# Patient Record
Sex: Female | Born: 1970 | Hispanic: No | Marital: Married | Smoking: Never smoker
Health system: Southern US, Community
[De-identification: ages and names within clinical notes are randomized; demographics above are authoritative.]

## PROBLEM LIST (undated history)

## (undated) HISTORY — PX: HERNIA REPAIR: SHX51

---

## 2008-12-23 LAB — CONVERTED CEMR LAB: Pap Smear: NORMAL

## 2009-02-25 ENCOUNTER — Ambulatory Visit: Payer: Self-pay | Admitting: Internal Medicine

## 2009-02-25 DIAGNOSIS — R059 Cough, unspecified: Secondary | ICD-10-CM | POA: Insufficient documentation

## 2009-02-25 DIAGNOSIS — R05 Cough: Secondary | ICD-10-CM

## 2009-02-27 ENCOUNTER — Ambulatory Visit: Payer: Self-pay | Admitting: Internal Medicine

## 2009-03-06 ENCOUNTER — Telehealth: Payer: Self-pay | Admitting: Internal Medicine

## 2009-12-30 IMAGING — CR DG CHEST 2V
2 series · 2 of 2 positions shown · non-contrast
Comparison: 02/25/2009.

CLINICAL DATA: Cough and short of breath.

CHEST - 2 VIEW

[view not recorded (1 of 2)]
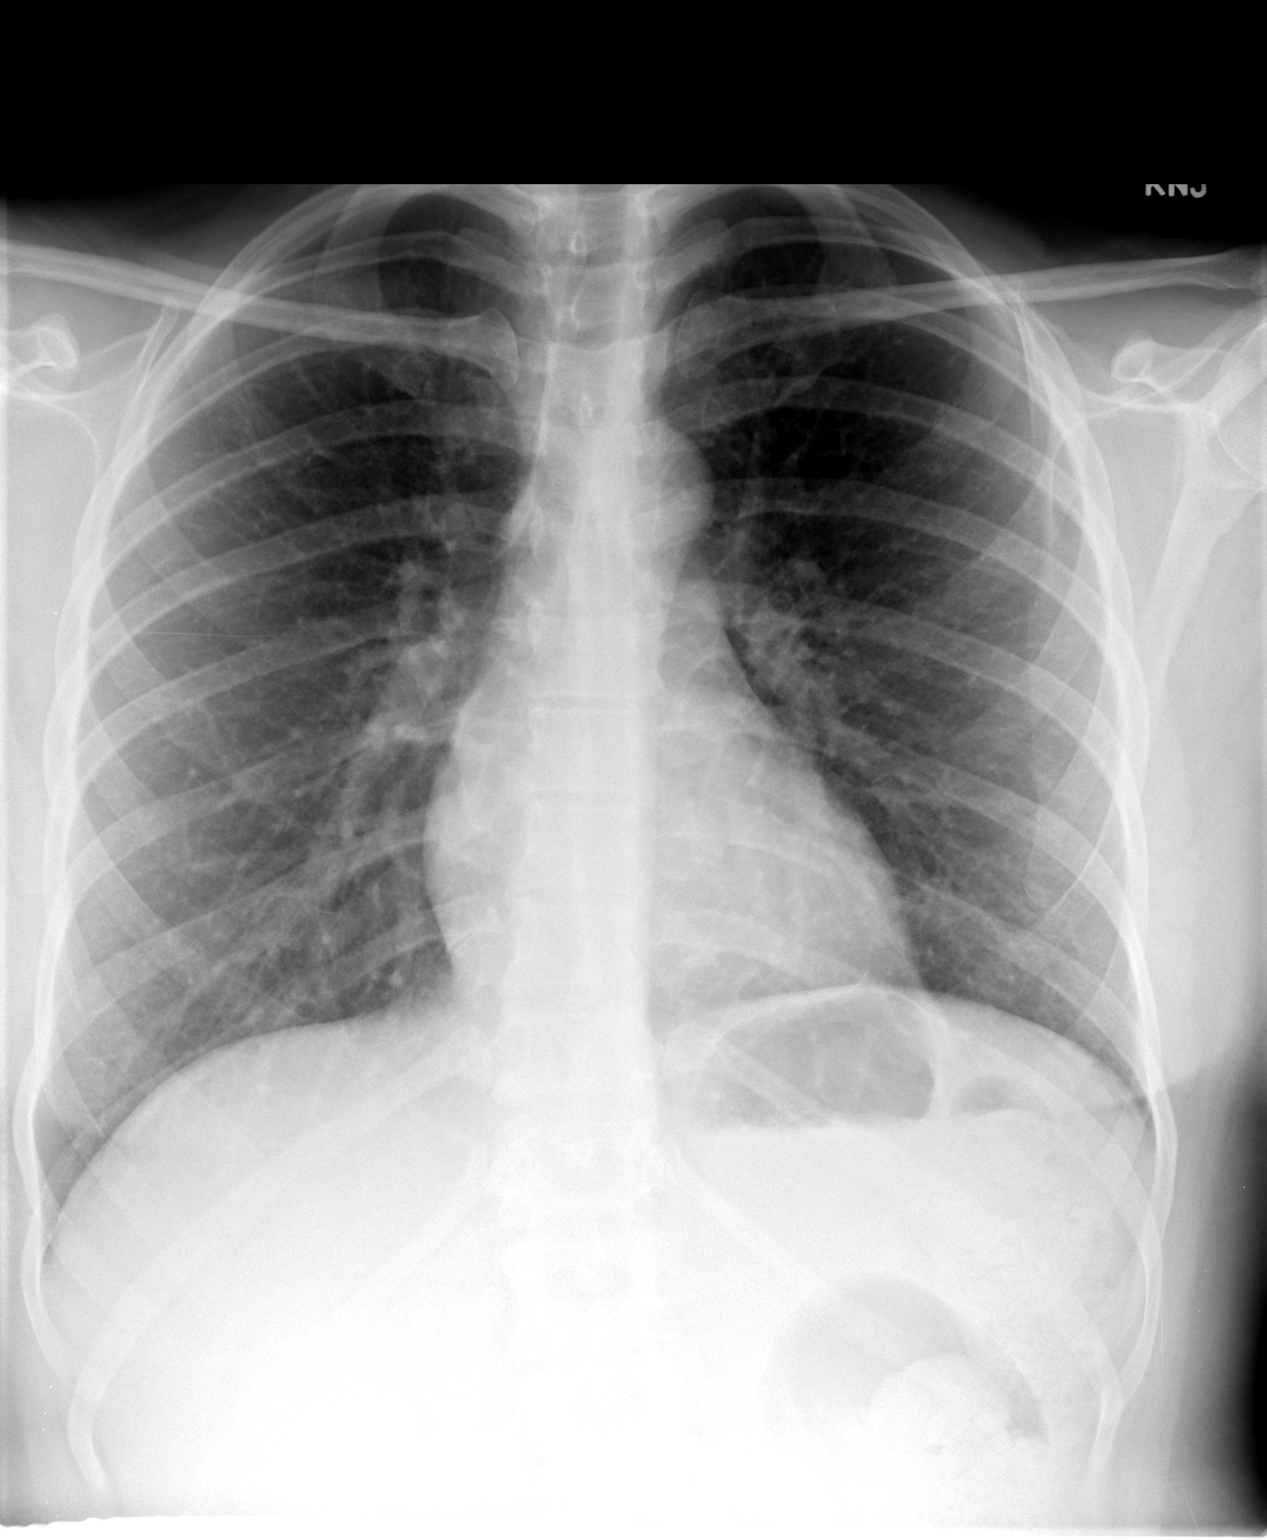

[view not recorded (2 of 2)]
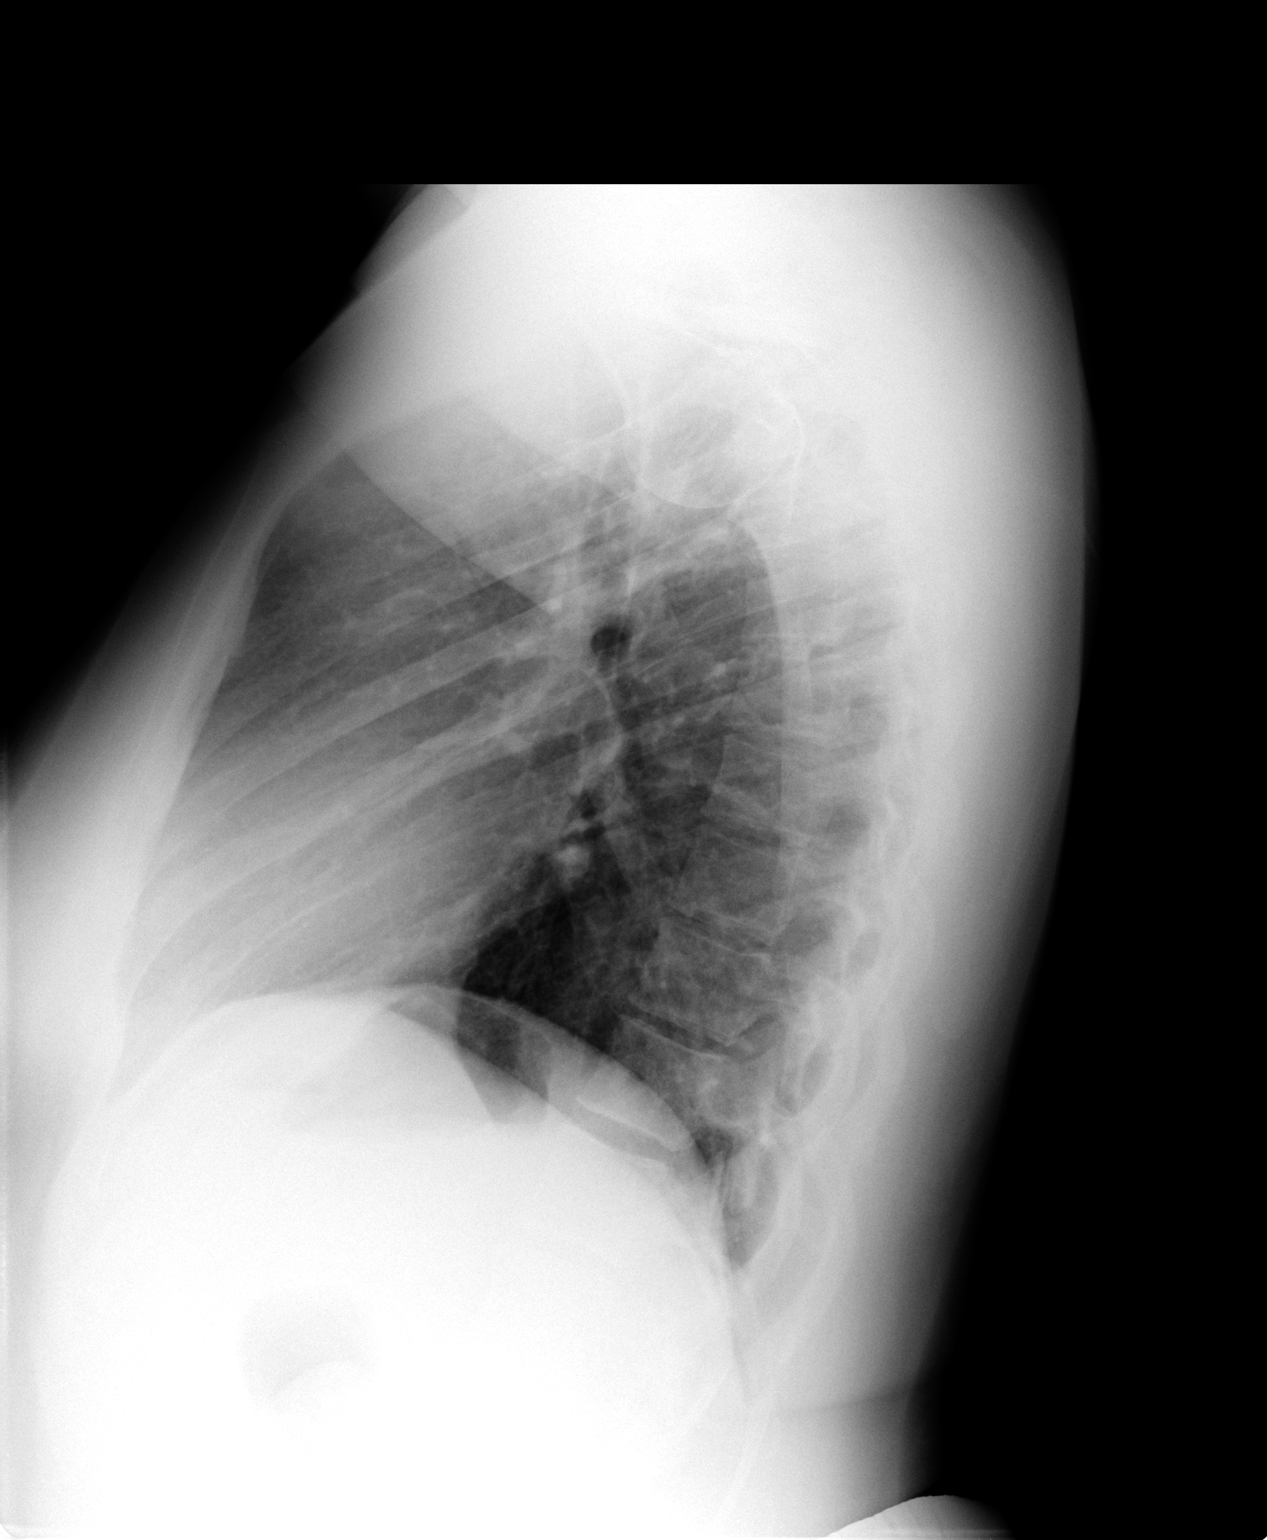

[2 of 2 positions shown; findings below may reference images not displayed]

FINDINGS: Heart size is normal.  There is no heart failure,
infiltrate, or effusion.  The lungs are clear.
IMPRESSION: No active cardiopulmonary disease.

## 2017-01-21 ENCOUNTER — Emergency Department (HOSPITAL_BASED_OUTPATIENT_CLINIC_OR_DEPARTMENT_OTHER)
Admission: EM | Admit: 2017-01-21 | Discharge: 2017-01-21 | Disposition: A | Discharge status: Home / Self Care | Payer: Self-pay | Attending: Family Medicine | Admitting: Family Medicine

## 2017-01-21 ENCOUNTER — Encounter (HOSPITAL_BASED_OUTPATIENT_CLINIC_OR_DEPARTMENT_OTHER): Payer: Self-pay | Admitting: Emergency Medicine

## 2017-01-21 DIAGNOSIS — Z5321 Procedure and treatment not carried out due to patient leaving prior to being seen by health care provider: Secondary | ICD-10-CM | POA: Insufficient documentation

## 2017-01-21 DIAGNOSIS — M25512 Pain in left shoulder: Secondary | ICD-10-CM | POA: Insufficient documentation

## 2017-01-21 NOTE — ED Triage Notes (Signed)
Patient states that she has left shoulder pain after a tetanus shot 01/10/2017. Patient sates she now has tingling in her fingers and can not sleep due to the pain.
